# Patient Record
Sex: Female | Born: 1995 | Race: Black or African American | Hispanic: No | Marital: Single | Smoking: Never smoker
Health system: Southern US, Community
[De-identification: ages and names within clinical notes are randomized; demographics above are authoritative.]

## PROBLEM LIST (undated history)

## (undated) DIAGNOSIS — L309 Dermatitis, unspecified: Secondary | ICD-10-CM

## (undated) DIAGNOSIS — D649 Anemia, unspecified: Secondary | ICD-10-CM

## (undated) DIAGNOSIS — F32A Depression, unspecified: Secondary | ICD-10-CM

## (undated) DIAGNOSIS — J45909 Unspecified asthma, uncomplicated: Secondary | ICD-10-CM

## (undated) DIAGNOSIS — E282 Polycystic ovarian syndrome: Secondary | ICD-10-CM

## (undated) DIAGNOSIS — F419 Anxiety disorder, unspecified: Secondary | ICD-10-CM

## (undated) HISTORY — DX: Anxiety disorder, unspecified: F41.9

## (undated) HISTORY — PX: TONSILLECTOMY: SUR1361

## (undated) HISTORY — PX: MULTIPLE TOOTH EXTRACTIONS: SHX2053

## (undated) HISTORY — DX: Depression, unspecified: F32.A

---

## 2021-09-04 ENCOUNTER — Inpatient Hospital Stay (HOSPITAL_COMMUNITY)
Admission: AD | Admit: 2021-09-04 | Discharge: 2021-09-04 | Disposition: A | Discharge status: Home / Self Care | Payer: Medicaid Other | Attending: Obstetrics & Gynecology | Admitting: Obstetrics & Gynecology

## 2021-09-04 ENCOUNTER — Ambulatory Visit (INDEPENDENT_AMBULATORY_CARE_PROVIDER_SITE_OTHER): Payer: Self-pay

## 2021-09-04 ENCOUNTER — Other Ambulatory Visit: Payer: Self-pay

## 2021-09-04 VITALS — BP 163/77 | HR 93 | Ht 66.0 in | Wt 332.9 lb

## 2021-09-04 DIAGNOSIS — Z3200 Encounter for pregnancy test, result unknown: Secondary | ICD-10-CM | POA: Diagnosis present

## 2021-09-04 DIAGNOSIS — Z3201 Encounter for pregnancy test, result positive: Secondary | ICD-10-CM

## 2021-09-04 LAB — POCT PREGNANCY, URINE: Preg Test, Ur: POSITIVE — AB

## 2021-09-04 NOTE — Patient Instructions (Signed)
Prenatal Care Providers           [facility]  [address] [phone]  [facility]   [address]  [phone]  [facility] [place]       [address] [phone]            [facility] [place]     [address] [phone]          [facility] [place]   [address] [phone]  [facility]  [address] [phone]     [facility]  [address]   [phone]      

## 2021-09-04 NOTE — MAU Provider Note (Signed)
Event Date/Time   First Provider Initiated Contact with Patient 09/04/21 1420      S Ms. Whitney Solomon is a 25 y.o. No obstetric history on file. patient who presents to MAU today requesting pregnancy confirmation. She denies any pain or bleeding. She reports positive UPT x2 at home.   O BP 112/88 (BP Location: Right Arm)   Pulse 85   Temp 99.6 F (37.6 C) (Oral)   Resp 20   Ht 5\' 6"  (1.676 m)   Wt (!) 152.4 kg   LMP 07/26/2021   SpO2 99%   BMI 54.23 kg/m  Physical Exam Vitals and nursing note reviewed.  Constitutional:      General: She is not in acute distress.    Appearance: She is well-developed.  HENT:     Head: Normocephalic.  Eyes:     Pupils: Pupils are equal, round, and reactive to light.  Cardiovascular:     Rate and Rhythm: Normal rate.  Pulmonary:     Effort: Pulmonary effort is normal. No respiratory distress.  Abdominal:     General: There is no distension.     Palpations: Abdomen is soft.     Tenderness: There is no abdominal tenderness.  Skin:    General: Skin is warm and dry.  Neurological:     Mental Status: She is alert and oriented to person, place, and time.  Psychiatric:        Mood and Affect: Mood normal.        Behavior: Behavior normal.        Thought Content: Thought content normal.        Judgment: Judgment normal.    A Medical screening exam complete 1. Possible pregnancy      P Discharge from MAU in stable condition Patient given the option of transfer to [facility] for further evaluation or seek care in outpatient facility of choice  List of options for follow-up given  Warning signs for worsening condition that would warrant emergency follow-up discussed Patient may return to MAU as needed   [facility], Rolm Bookbinder 09/04/2021 2:23 PM

## 2021-09-04 NOTE — Progress Notes (Signed)
Pt dropped off urine today for UPT. UPT is positive. Pt states has positive home UPT as well. Pt has hx of 2 SAB in [place]. Pt to sign ROI today for records. Pt states hx of pre-diabetes and possible CHTN.    Pt denies any vaginal bleeding, abd pain or cramps at this time. Pt advised to start taking PNV and to call OB provider for next OB appt. [facility] list given to pt. Pt also needing pregnancy verification letter to change to pregnancy medicaid. Letter given. Pt signed up for Mychart today.  Pt verbalized understanding and agreeable to plan of care.   LMP: 07/26/21 EDC: 05/02/22 [redacted]w[redacted]d   Azzam Mehra, RN

## 2021-09-04 NOTE — Progress Notes (Signed)
Patient seen and assessed by nursing staff.  Agree with documentation and plan.  

## 2021-09-04 NOTE — MAU Note (Signed)
Needs a confirmation of pregnancy.  Denies problems.  +HPT last on the 19th and the 24th.

## 2021-09-19 DIAGNOSIS — J45909 Unspecified asthma, uncomplicated: Secondary | ICD-10-CM

## 2021-09-19 DIAGNOSIS — O99519 Diseases of the respiratory system complicating pregnancy, unspecified trimester: Secondary | ICD-10-CM

## 2021-09-30 DIAGNOSIS — J45909 Unspecified asthma, uncomplicated: Secondary | ICD-10-CM | POA: Insufficient documentation

## 2021-09-30 DIAGNOSIS — O99519 Diseases of the respiratory system complicating pregnancy, unspecified trimester: Secondary | ICD-10-CM | POA: Insufficient documentation

## 2021-09-30 MED ORDER — ALBUTEROL SULFATE HFA 108 (90 BASE) MCG/ACT IN AERS: 2.0000 | INHALATION_SPRAY | Freq: Four times a day (QID) | RESPIRATORY_TRACT | 3 refills | Status: DC | PRN

## 2021-10-05 ENCOUNTER — Encounter (HOSPITAL_COMMUNITY): Payer: Self-pay | Admitting: Physician Assistant

## 2021-10-05 ENCOUNTER — Other Ambulatory Visit: Payer: Self-pay

## 2021-10-05 ENCOUNTER — Inpatient Hospital Stay (HOSPITAL_COMMUNITY)
Admission: AD | Admit: 2021-10-05 | Discharge: 2021-10-06 | Disposition: A | Discharge status: Home / Self Care | Payer: Medicaid Other | Attending: Obstetrics & Gynecology | Admitting: Obstetrics & Gynecology

## 2021-10-05 DIAGNOSIS — Z3A10 10 weeks gestation of pregnancy: Secondary | ICD-10-CM

## 2021-10-05 DIAGNOSIS — O210 Mild hyperemesis gravidarum: Secondary | ICD-10-CM

## 2021-10-05 DIAGNOSIS — O219 Vomiting of pregnancy, unspecified: Secondary | ICD-10-CM | POA: Insufficient documentation

## 2021-10-05 HISTORY — DX: Polycystic ovarian syndrome: E28.2

## 2021-10-05 HISTORY — DX: Dermatitis, unspecified: L30.9

## 2021-10-05 HISTORY — DX: Unspecified asthma, uncomplicated: J45.909

## 2021-10-05 HISTORY — DX: Anemia, unspecified: D64.9

## 2021-10-05 LAB — URINALYSIS, ROUTINE W REFLEX MICROSCOPIC
Bilirubin Urine: NEGATIVE
Glucose, UA: NEGATIVE mg/dL
Hgb urine dipstick: NEGATIVE
Ketones, ur: 20 mg/dL — AB
Nitrite: NEGATIVE
Protein, ur: 30 mg/dL — AB
Specific Gravity, Urine: 1.027 (ref 1.005–1.030)
pH: 6 (ref 5.0–8.0)

## 2021-10-05 MED ORDER — ACETAMINOPHEN 500 MG PO TABS
1000.0000 mg | ORAL_TABLET | Freq: Once | ORAL | Status: AC
  Administered 2021-10-05: 1000 mg via ORAL
  Filled 2021-10-05: qty 2

## 2021-10-05 MED ORDER — SODIUM CHLORIDE 0.9 % IV SOLN
25.0000 mg | Freq: Once | INTRAVENOUS | Status: AC
  Administered 2021-10-05: 25 mg via INTRAVENOUS
  Filled 2021-10-05: qty 1

## 2021-10-05 NOTE — ED Notes (Signed)
Report given to [place] in MAU

## 2021-10-05 NOTE — MAU Provider Note (Signed)
Chief Complaint:  Emesis  HPI: Whitney Solomon is a 25 y.o. G3P0020 at [redacted]w[redacted]d who presents to maternity admissions reporting nausea and vomiting for 1 week. Reports she has not had any vomiting for the past 24 hours and has been able to tolerate PO fluids and food although appetite is decreased. She denies pain, vaginal bleeding, discharge, or urinary s/s.    Pregnancy Course:   Past Medical History:  Diagnosis Date   Anemia    Asthma    Eczema    Polycystic ovarian syndrome    OB History  Gravida Para Term Preterm AB Living  3       2    SAB IAB Ectopic Multiple Live Births  2            # Outcome Date GA Lbr Len/2nd Weight Sex Delivery Anes PTL Lv  3 Current           2 SAB           1 SAB            Past Surgical History:  Procedure Laterality Date   TONSILLECTOMY     Family History  Problem Relation Age of Onset   Breast cancer Mother    Thyroid cancer Mother    Hypertension Mother    Hypertension Father    Hypertension Brother    Social History   Tobacco Use   Smoking status: Never   Smokeless tobacco: Never  Substance Use Topics   Alcohol use: Not Currently   Drug use: Not Currently   No Known Allergies No medications prior to admission.    I have reviewed patient's Past Medical Hx, Surgical Hx, Family Hx, Social Hx, medications and allergies.   ROS:  Review of Systems  Constitutional:  Positive for appetite change (decreased).  Respiratory: Negative.    Cardiovascular: Negative.   Gastrointestinal:  Positive for nausea and vomiting. Negative for abdominal pain.  Genitourinary: Negative.   Musculoskeletal: Negative.   Neurological: Negative.   Psychiatric/Behavioral: Negative.     Physical Exam  Patient Vitals for the past 24 hrs:  BP Temp Temp src Pulse Resp SpO2 Height Weight  10/06/21 0047 124/61 98.1 F (36.7 C) Oral 70 -- 100 % -- --  10/05/21 2100 124/67 -- -- 96 20 99 % -- --  10/05/21 2015 130/76 -- -- 90 18 -- 5\' 6"  (1.676 m) (!) 147.4  kg  10/05/21 1849 (!) 148/89 98.8 F (37.1 C) -- 100 18 100 % -- --   Constitutional: well-developed, well-nourished female in no acute distress.  Cardiovascular: normal rate Respiratory: normal effort GI: abd soft, non-tender MS: extremities nontender, no edema, normal ROM Neurologic: alert and oriented x 4.  Pelvic: not indicated      Labs: Results for orders placed or performed during the hospital encounter of 10/05/21 (from the past 24 hour(s))  Urinalysis, Routine w reflex microscopic Urine, Clean Catch     Status: Abnormal   Collection Time: 10/05/21  8:19 PM  Result Value Ref Range   Color, Urine YELLOW YELLOW   APPearance HAZY (A) CLEAR   Specific Gravity, Urine 1.027 1.005 - 1.030   pH 6.0 5.0 - 8.0   Glucose, UA NEGATIVE NEGATIVE mg/dL   Hgb urine dipstick NEGATIVE NEGATIVE   Bilirubin Urine NEGATIVE NEGATIVE   Ketones, ur 20 (A) NEGATIVE mg/dL   Protein, ur 30 (A) NEGATIVE mg/dL   Nitrite NEGATIVE NEGATIVE   Leukocytes,Ua MODERATE (A) NEGATIVE   RBC /  HPF 0-5 0 - 5 RBC/hpf   WBC, UA 6-10 0 - 5 WBC/hpf   Bacteria, UA MANY (A) NONE SEEN   Squamous Epithelial / LPF 11-20 0 - 5   Mucus PRESENT     Imaging:  No results found.  MAU Course: Orders Placed This Encounter  Procedures   OB Urine Culture   Urinalysis, Routine w reflex microscopic Urine, Clean Catch   Discharge patient   Meds ordered this encounter  Medications   promethazine (PHENERGAN) 25 mg in sodium chloride 0.9 % 1,000 mL infusion   acetaminophen (TYLENOL) tablet 1,000 mg   promethazine (PHENERGAN) 25 MG tablet    Sig: Take 1 tablet (25 mg total) by mouth every 6 (six) hours as needed for nausea or vomiting.    Dispense:  30 tablet    Refill:  0    Order Specific Question:   Supervising Provider    Answer:   Samara Snide    MDM: UA with culture pending IVF bolus with promethazine Tylenol for HA Patient reports nausea improved after medication, able to tolerate  PO  Assessment: 1. Nausea and vomiting during pregnancy     Plan: Discharge home in stable condition Strict return precautions reviewed. Return to MAU if symptoms worsen Keep appointment as scheduled on 11/16  Rx for phenergan sent    Follow-up Information     [facility] Follow up.   Specialty: Obstetrics and Gynecology Why: as scheduled. Return to MAU for worsening symptoms Contact information: [address] [place] [address] [phone]                Allergies as of 10/06/2021   No Known Allergies      Medication List     TAKE these medications    albuterol 108 (90 Base) MCG/ACT inhaler Commonly known as: VENTOLIN HFA Inhale 2 puffs into the lungs every 6 (six) hours as needed for wheezing or shortness of breath.   multivitamin-prenatal 27-0.8 MG Tabs tablet Take 1 tablet by mouth daily at 12 noon.   promethazine 25 MG tablet Commonly known as: PHENERGAN Take 1 tablet (25 mg total) by mouth every 6 (six) hours as needed for nausea or vomiting.         Camelia Eng, CNM 10/06/2021 1:28 AM

## 2021-10-05 NOTE — MAU Note (Signed)
Pt c/o n/v x 1.5 weeks. Unable to eat. Pt reports she has taken dramamine but that is not helping.

## 2021-10-05 NOTE — ED Triage Notes (Signed)
Pt states she is [redacted] weeks pregnant and c/o nausea and vomiting.

## 2021-10-05 NOTE — ED Provider Notes (Signed)
Emergency Medicine Provider OB Triage Evaluation Note  ALAZAY LEICHT is a 25 y.o. female, G3P0020  at Unknown gestation who presents to the emergency department with complaints of nausea and vomiting.    Review of  Systems  Positive: Nausea and vomiting Negative: fevers   Physical Exam  BP (!) 148/89 (BP Location: Right Arm)   Pulse 100   Temp 98.8 F (37.1 C)   Resp 18   LMP 07/26/2021 (Exact Date)   SpO2 100%  General: Awake, no distress  HEENT: Atraumatic  Resp: Normal effort  Cardiac: Normal rate Abd: Nondistended, nontender  MSK: Moves all extremities without difficulty Neuro: Speech clear  Medical Decision Making  Pt evaluated for pregnancy concern and is stable for transfer to MAU. Pt is in agreement with plan for transfer.  6:52 PM Discussed with MAU APP, Victorino Dike, who accepts patient in transfer.  Clinical Impression   1. Nausea and vomiting during pregnancy        Norman Clay 10/05/21 6195    Tegeler, Canary Brim, MD 10/05/21 2018

## 2021-10-06 MED ORDER — PROMETHAZINE HCL 25 MG PO TABS: 25.0000 mg | ORAL_TABLET | Freq: Four times a day (QID) | ORAL | 0 refills | Status: DC | PRN

## 2021-10-07 LAB — CULTURE, OB URINE

## 2021-10-10 ENCOUNTER — Telehealth (INDEPENDENT_AMBULATORY_CARE_PROVIDER_SITE_OTHER): Payer: Medicaid Other

## 2021-10-10 DIAGNOSIS — Z3490 Encounter for supervision of normal pregnancy, unspecified, unspecified trimester: Secondary | ICD-10-CM

## 2021-10-10 NOTE — Progress Notes (Signed)
Called pt at 1421; VM left stating I am calling to begin virtual appt. Direct link sent to MyChart video visit.   Called pt at 1436; VM left stating I am calling for virtual appt. Requested pt return call to the office or respond to MyChart message to reschedule.

## 2021-10-15 ENCOUNTER — Inpatient Hospital Stay (HOSPITAL_COMMUNITY)
Admission: AD | Admit: 2021-10-15 | Discharge: 2021-10-15 | Disposition: A | Discharge status: Home / Self Care | Payer: Medicaid Other | Attending: Obstetrics and Gynecology | Admitting: Obstetrics and Gynecology

## 2021-10-15 ENCOUNTER — Encounter (HOSPITAL_COMMUNITY): Payer: Self-pay | Admitting: Obstetrics and Gynecology

## 2021-10-15 ENCOUNTER — Other Ambulatory Visit: Payer: Self-pay

## 2021-10-15 DIAGNOSIS — O219 Vomiting of pregnancy, unspecified: Secondary | ICD-10-CM

## 2021-10-15 DIAGNOSIS — Z3A11 11 weeks gestation of pregnancy: Secondary | ICD-10-CM | POA: Diagnosis not present

## 2021-10-15 LAB — URINALYSIS, ROUTINE W REFLEX MICROSCOPIC
Bilirubin Urine: NEGATIVE
Glucose, UA: NEGATIVE mg/dL
Hgb urine dipstick: NEGATIVE
Ketones, ur: NEGATIVE mg/dL
Nitrite: NEGATIVE
Protein, ur: 30 mg/dL — AB
Specific Gravity, Urine: 1.029 (ref 1.005–1.030)
pH: 6 (ref 5.0–8.0)

## 2021-10-15 MED ORDER — ONDANSETRON 8 MG PO TBDP: 8.0000 mg | ORAL_TABLET | Freq: Three times a day (TID) | ORAL | 0 refills | Status: DC | PRN

## 2021-10-15 MED ORDER — ONDANSETRON 4 MG PO TBDP
8.0000 mg | ORAL_TABLET | Freq: Once | ORAL | Status: AC
  Administered 2021-10-15: 8 mg via ORAL
  Filled 2021-10-15: qty 2

## 2021-10-15 NOTE — MAU Note (Signed)
Was here about 1 1/2 wks ago for n/v.  Was given a medication.  Worked first few days, and now it's just not working.  Can't keep anything down, so she is not eating. Stomach hurts a little from throwing up.

## 2021-10-15 NOTE — MAU Provider Note (Signed)
History     CSN: [id number]  Arrival date and time: 10/15/21 1548   Event Date/Time   First Provider Initiated Contact with Patient 10/15/21 1810      Chief Complaint  Patient presents with   Emesis   Whitney Solomon is a 25 y.o. G3P0020 at [redacted]w[redacted]d who presents today with nausea and vomiting. She reports that she was here a few weeks ago, and given a medication. She states that it was working until a few days ago and then it stopped. She last took it on 10/11/2021.   Emesis  This is a new problem. The current episode started 1 to 4 weeks ago. The problem occurs 2 to 4 times per day. The problem has been unchanged. The emesis has an appearance of stomach contents. There has been no fever. Risk factors: pregnancy. Treatments tried: phenergan. Improvement on treatment: was working, but stopped. Last took on 11/4.   OB History     Gravida  3   Para      Term      Preterm      AB  2   Living         SAB  2   IAB      Ectopic      Multiple      Live Births              Past Medical History:  Diagnosis Date   Anemia    Asthma    Eczema    Polycystic ovarian syndrome     Past Surgical History:  Procedure Laterality Date   TONSILLECTOMY      Family History  Problem Relation Age of Onset   Breast cancer Mother    Thyroid cancer Mother    Hypertension Mother    Hypertension Father    Hypertension Brother     Social History   Tobacco Use   Smoking status: Never   Smokeless tobacco: Never  Substance Use Topics   Alcohol use: Not Currently   Drug use: Not Currently    Allergies: No Known Allergies  Medications Prior to Admission  Medication Sig Dispense Refill Last Dose   albuterol (VENTOLIN HFA) 108 (90 Base) MCG/ACT inhaler Inhale 2 puffs into the lungs every 6 (six) hours as needed for wheezing or shortness of breath. 8 g 3    Prenatal Vit-Fe Fumarate-FA (MULTIVITAMIN-PRENATAL) 27-0.8 MG TABS tablet Take 1 tablet by mouth daily at 12 noon.    10/09/2021   promethazine (PHENERGAN) 25 MG tablet Take 1 tablet (25 mg total) by mouth every 6 (six) hours as needed for nausea or vomiting. 30 tablet 0 10/11/2021    Review of Systems  Gastrointestinal:  Positive for vomiting.  All other systems reviewed and are negative. Physical Exam   Blood pressure 125/78, pulse 78, temperature 98.2 F (36.8 C), temperature source Oral, resp. rate 18, height 5\' 6"  (1.676 m), weight (!) 145.4 kg, last menstrual period 07/26/2021, SpO2 100 %.  Physical Exam Vitals and nursing note reviewed.  Constitutional:      General: She is not in acute distress. HENT:     Head: Normocephalic.  Eyes:     Pupils: Pupils are equal, round, and reactive to light.  Cardiovascular:     Rate and Rhythm: Normal rate.  Pulmonary:     Effort: Pulmonary effort is normal.  Abdominal:     Palpations: Abdomen is soft.     Tenderness: There is no abdominal tenderness.  Skin:  General: Skin is warm and dry.  Neurological:     Mental Status: She is alert and oriented to person, place, and time.  Psychiatric:        Behavior: Behavior normal.   Pt informed that the ultrasound is considered a limited OB ultrasound and is not intended to be a complete ultrasound exam.  Patient also informed that the ultrasound is not being completed with the intent of assessing for fetal or placental anomalies or any pelvic abnormalities.  Explained that the purpose of today's ultrasound is to assess for  viability.  Patient acknowledges the purpose of the exam and the limitations of the study.    FHT 160  Results for orders placed or performed during the hospital encounter of 10/15/21 (from the past 24 hour(s))  Urinalysis, Routine w reflex microscopic Urine, Clean Catch     Status: Abnormal   Collection Time: 10/15/21  5:36 PM  Result Value Ref Range   Color, Urine AMBER (A) YELLOW   APPearance CLOUDY (A) CLEAR   Specific Gravity, Urine 1.029 1.005 - 1.030   pH 6.0 5.0 - 8.0    Glucose, UA NEGATIVE NEGATIVE mg/dL   Hgb urine dipstick NEGATIVE NEGATIVE   Bilirubin Urine NEGATIVE NEGATIVE   Ketones, ur NEGATIVE NEGATIVE mg/dL   Protein, ur 30 (A) NEGATIVE mg/dL   Nitrite NEGATIVE NEGATIVE   Leukocytes,Ua LARGE (A) NEGATIVE   RBC / HPF 6-10 0 - 5 RBC/hpf   WBC, UA 6-10 0 - 5 WBC/hpf   Bacteria, UA RARE (A) NONE SEEN   Squamous Epithelial / LPF 11-20 0 - 5   Mucus PRESENT    Amorphous Crystal PRESENT      MAU Course  Procedures  MDM  Patient given zofran ODT. She is tolerating PO.   Assessment and Plan   1. Nausea/vomiting in pregnancy   2. [redacted] weeks gestation of pregnancy    DC home Comfort measures reviewed  1st Trimester precautions  RX: zofran 8mg  ODT #20  Return to MAU as needed Start prenatal care    Follow-up Information     Department, [facility] Follow up.   Contact information: [address] [place] [phone]                [phone] DNP, CNM  10/15/21  7:55 PM

## 2021-10-21 ENCOUNTER — Other Ambulatory Visit: Payer: Self-pay

## 2021-10-21 ENCOUNTER — Encounter: Payer: Self-pay | Admitting: *Deleted

## 2021-10-21 ENCOUNTER — Telehealth (INDEPENDENT_AMBULATORY_CARE_PROVIDER_SITE_OTHER): Payer: Medicaid Other | Admitting: *Deleted

## 2021-10-21 DIAGNOSIS — Z3490 Encounter for supervision of normal pregnancy, unspecified, unspecified trimester: Secondary | ICD-10-CM | POA: Insufficient documentation

## 2021-10-21 DIAGNOSIS — J45909 Unspecified asthma, uncomplicated: Secondary | ICD-10-CM

## 2021-10-21 DIAGNOSIS — Z3A00 Weeks of gestation of pregnancy not specified: Secondary | ICD-10-CM

## 2021-10-21 DIAGNOSIS — O99210 Obesity complicating pregnancy, unspecified trimester: Secondary | ICD-10-CM

## 2021-10-21 MED ORDER — GOJJI WEIGHT SCALE MISC
1.0000 | Freq: Once | 0 refills | Status: AC
Start: 2021-10-21 — End: 2021-10-21

## 2021-10-21 MED ORDER — BLOOD PRESSURE KIT DEVI: 1.0000 | 0 refills | Status: DC | PRN

## 2021-10-21 NOTE — Patient Instructions (Signed)
  At our [facility] OB/GYN Practices, we work as an integrated team, providing care to address both physical and emotional health. Your medical provider may refer you to see our Behavioral Health Clinician [facility]) on the same day you see your medical provider, as availability permits; often scheduled virtually at your convenience.  Our [facility] is available to all patients, visits generally last between 20-30 minutes, but can be longer or shorter, depending on patient need. The [facility] offers help with stress management, coping with symptoms of depression and anxiety, major life changes , sleep issues, changing risky behavior, grief and loss, life stress, working on personal life goals, and  behavioral health issues, as these all affect your overall health and wellness.  The [facility] is NOT available for the following: FMLA paperwork, court-ordered evaluations, specialty assessments (custody or disability), letters to employers, or obtaining certification for an emotional support animal. The [facility] does not provide long-term therapy. You have the right to refuse integrated behavioral health services, or to reschedule to see the [facility] at a later date.  Confidentiality exception: If it is suspected that a child or disabled adult is being abused or neglected, we are required by law to report that to either Child Protective Services or Adult Management consultant.  If you have a diagnosis of Bipolar affective disorder, Schizophrenia, or recurrent Major depressive disorder, we will recommend that you establish care with a psychiatrist, as these are lifelong, chronic conditions, and we want your overall emotional health and medications to be more closely monitored. If you anticipate needing extended maternity leave due to mental health issues postpartum, it it recommended you inform your medical provider, so we can put in a referral to a psychiatrist as soon as possible. The [facility] is unable to recommend an extended maternity leave for mental  health issues. Your medical provider or [facility] may refer you to a therapist for ongoing, traditional therapy, or to a psychiatrist, for medication management, if it would benefit your overall health. Depending on your insurance, you may have a copay or be charged a deductible, depending on your insurance, to see the [facility]. If you are uninsured, it is recommended that you apply for financial assistance. (Forms may be requested at the front desk for in-person visits, via MyChart, or request a form during a virtual visit).  If you see the [facility] more than 6 times, you will have to complete a comprehensive clinical assessment interview with the [facility] to resume integrated services.  For virtual visits with the [facility], you must be physically in the state of [place] at the time of the visit. For example, if you live in [place], you will have to do an in-person visit with the [facility], and your out-of-state insurance may not cover behavioral health services in [place]. If you are going out of the state or country for any reason, the [facility] may see you virtually when you return to [place], but not while you are physically outside of [place].     Here is a link to the Pregnancy Navigators . Please Fill out prior to your New OB appointment.   English Link: [email or web address]  Spanish Link: [email or web address]

## 2021-10-21 NOTE — Progress Notes (Signed)
New OB Intake  I connected with  Whitney Solomon on 10/21/21 at 9:39 by MyChart Video Visit and verified that I am speaking with the correct person using two identifiers. Nurse is located at [facility] and pt is located at home.  I discussed the limitations, risks, security and privacy concerns of performing an evaluation and management service by telephone and the availability of in person appointments. I also discussed with the patient that there may be a patient responsible charge related to this service. The patient expressed understanding and agreed to proceed.  I explained I am completing New OB Intake today. We discussed her EDD of 05/02/22 that is based on LMP of 07/26/22. She states she had an [place] at the [facility] that  said her EDD 05/12/21. I asked her to call them to send the [place] report to [place]. Pt is G3/P0020. I reviewed her allergies, medications, Medical/Surgical/OB history, and appropriate screenings. I informed her of [facility] services. Based on history, this is a/an  pregnancy uncomplicated .   Patient Active Problem List   Diagnosis Date Noted   Supervision of low-risk pregnancy 10/21/2021   Asthma    Obesity in pregnancy    Asthma during pregnancy 09/30/2021    Concerns addressed today  Delivery Plans:  Plans to deliver at [facility].   MyChart/Babyscripts MyChart access verified. I explained pt will have some visits in office and some virtually. Babyscripts instructions given and order placed. Patient verifies receipt of registration text/e-mail.   Blood Pressure Cuff  Blood pressure cuff ordered for patient to pick-up from [employer]. Explained after first prenatal appt pt will check weekly and document in Babyscripts.  Weight scale: Patient  does not   have weight scale. Weight scale ordered for patient to pick up form [facility].   Anatomy US Explained first scheduled [place] will be around 19 weeks. Anatomy US will be scheduled for and patient notified by MyChart.    Labs Discussed Avelina Laine genetic screening with patient. Would like both Panorama and Horizon drawn at new OB visit. Routine prenatal labs needed.  Covid Vaccine Patient has had covid vaccine.   Centering in pregnancy offered, patient declines.   Mother/ Baby Dyad Candidate?    If yes, offer as possibility. Patient declines  Informed patient of [facility] website  and placed link in her AVS.   Social Determinants of Health Food Insecurity: Patient expresses food insecurity. Food Market information given to patient; explained patient may visit at the end of first OB appointment. WIC Referral: Patient is interested in referral to [facility].  Transportation: Patient denies transportation needs. Childcare: Discussed no children allowed at ultrasound appointments. Offered childcare services; patient declines childcare services at this time.  Informed about  Pregnancy Navigators and link sent.    Placed OB Box on problem list and updated  First visit review I reviewed new OB appt with pt. I explained she will have a pelvic exam, ob bloodwork with genetic screening, and PAP smear. Explained pt will be seen by Payton Emerald at first visit; encounter routed to appropriate provider. Explained that patient will be seen by pregnancy navigator following visit with provider. [facility] information placed in AVS.   Millard Bautch,RN 10/21/2021  1:09 PM

## 2021-10-22 NOTE — Progress Notes (Signed)
Patient was assessed and managed by nursing staff during this encounter. I have reviewed the chart and agree with the documentation and plan.   Edd Arbour, MSN, CNM, [facility] 10/22/21 4:23 PM

## 2021-10-23 ENCOUNTER — Ambulatory Visit (INDEPENDENT_AMBULATORY_CARE_PROVIDER_SITE_OTHER): Payer: Medicaid Other | Admitting: Certified Nurse Midwife

## 2021-10-23 ENCOUNTER — Other Ambulatory Visit: Payer: Self-pay

## 2021-10-23 ENCOUNTER — Encounter: Payer: Self-pay | Admitting: Certified Nurse Midwife

## 2021-10-23 VITALS — BP 118/76 | HR 86 | Wt 320.3 lb

## 2021-10-23 DIAGNOSIS — O99210 Obesity complicating pregnancy, unspecified trimester: Secondary | ICD-10-CM | POA: Diagnosis not present

## 2021-10-23 DIAGNOSIS — Z3490 Encounter for supervision of normal pregnancy, unspecified, unspecified trimester: Secondary | ICD-10-CM

## 2021-10-23 DIAGNOSIS — Z3A12 12 weeks gestation of pregnancy: Secondary | ICD-10-CM | POA: Diagnosis not present

## 2021-10-23 DIAGNOSIS — Z5941 Food insecurity: Secondary | ICD-10-CM

## 2021-10-23 DIAGNOSIS — Z23 Encounter for immunization: Secondary | ICD-10-CM

## 2021-10-23 DIAGNOSIS — Z3491 Encounter for supervision of normal pregnancy, unspecified, first trimester: Secondary | ICD-10-CM

## 2021-10-23 DIAGNOSIS — O219 Vomiting of pregnancy, unspecified: Secondary | ICD-10-CM

## 2021-10-23 LAB — POCT URINALYSIS DIP (DEVICE)
Bilirubin Urine: NEGATIVE
Glucose, UA: NEGATIVE mg/dL
Hgb urine dipstick: NEGATIVE
Ketones, ur: NEGATIVE mg/dL
Leukocytes,Ua: NEGATIVE
Nitrite: NEGATIVE
Protein, ur: NEGATIVE mg/dL
Specific Gravity, Urine: >=1.03 (ref 1.005–1.030)
Urobilinogen, UA: 0.2 mg/dL (ref 0.0–1.0)
pH: 6 (ref 5.0–8.0)

## 2021-10-23 MED ORDER — BLOOD PRESSURE KIT DEVI: 1.0000 | 0 refills | Status: DC | PRN

## 2021-10-23 MED ORDER — ONDANSETRON 8 MG PO TBDP
8.0000 mg | ORAL_TABLET | Freq: Three times a day (TID) | ORAL | 3 refills | Status: AC | PRN
Start: 2021-10-23 — End: ?

## 2021-10-23 NOTE — Progress Notes (Signed)
Home Medicaid Form completed  [facility]  10/23/21

## 2021-10-23 NOTE — Progress Notes (Signed)
Patient had elevated PHQ-9/GAD-7. Patient declined seeing [facility].   Alesia Richards, RN 10/23/21

## 2021-10-24 LAB — CBC/D/PLT+RPR+RH+ABO+RUBIGG...
Antibody Screen: NEGATIVE
Basophils Absolute: 0 10*3/uL (ref 0.0–0.2)
Basos: 0 %
EOS (ABSOLUTE): 0.4 10*3/uL (ref 0.0–0.4)
Eos: 5 %
HCV Ab: 0.1 s/co ratio (ref 0.0–0.9)
HIV Screen 4th Generation wRfx: NONREACTIVE
Hematocrit: 38.9 % (ref 34.0–46.6)
Hemoglobin: 12.8 g/dL (ref 11.1–15.9)
Hepatitis B Surface Ag: NEGATIVE
Immature Grans (Abs): 0 10*3/uL (ref 0.0–0.1)
Immature Granulocytes: 0 %
Lymphocytes Absolute: 2.1 10*3/uL (ref 0.7–3.1)
Lymphs: 27 %
MCH: 25.7 pg — ABNORMAL LOW (ref 26.6–33.0)
MCHC: 32.9 g/dL (ref 31.5–35.7)
MCV: 78 fL — ABNORMAL LOW (ref 79–97)
Monocytes Absolute: 0.4 10*3/uL (ref 0.1–0.9)
Monocytes: 5 %
Neutrophils Absolute: 4.9 10*3/uL (ref 1.4–7.0)
Neutrophils: 63 %
Platelets: 361 10*3/uL (ref 150–450)
RBC: 4.99 x10E6/uL (ref 3.77–5.28)
RDW: 15.5 % — ABNORMAL HIGH (ref 11.7–15.4)
RPR Ser Ql: NONREACTIVE
Rh Factor: POSITIVE
Rubella Antibodies, IGG: 3.49 index (ref >0.99)
WBC: 7.9 10*3/uL (ref 3.4–10.8)

## 2021-10-24 LAB — HEMOGLOBIN A1C
Est. average glucose Bld gHb Est-mCnc: 123 mg/dL
Hgb A1c MFr Bld: 5.9 % — ABNORMAL HIGH (ref 4.8–5.6)

## 2021-10-24 LAB — HCV INTERPRETATION

## 2021-10-24 NOTE — Progress Notes (Signed)
History:   Whitney Solomon is a 25 y.o. G3P0020 at 41w6dby LMP, early ultrasound being seen today for her first obstetrical visit.  Her obstetrical history is significant for  two previous early first trimester losses . Patient does intend to breast feed. Pregnancy history fully reviewed.  Patient reports  improved nausea/vomiting, says the zofran is working well for her. Still has severe nausea with 5-6 episodes of vomiting per day if she does not take zofran. Doing well otherwise .      HISTORY: OB History  Gravida Para Term Preterm AB Living  3 0 0 0 2 0  SAB IAB Ectopic Multiple Live Births  2 0 0 0 0    # Outcome Date GA Lbr Len/2nd Weight Sex Delivery Anes PTL Lv  3 Current           2 SAB 06/2019 [id number]       1 SAB 01/2017 [id number]        Pt says [place] was completed recently in [place] (and was normal), has filled out records request.  Past Medical History:  Diagnosis Date   Anemia    Anxiety    Asthma    Depression    Eczema    Polycystic ovarian syndrome    Past Surgical History:  Procedure Laterality Date   MULTIPLE TOOTH EXTRACTIONS     TONSILLECTOMY     Family History  Problem Relation Age of Onset   Depression Mother    Anxiety disorder Mother    Hyperlipidemia Mother    Breast cancer Mother    Thyroid cancer Mother    Hypertension Mother    Fibromyalgia Mother    Bipolar disorder Mother    Fibroids Mother    Hypertension Father    Bipolar disorder Father    Thyroid disease Sister    Hypertension Brother    Social History   Tobacco Use   Smoking status: Never   Smokeless tobacco: Never  Vaping Use   Vaping Use: Never used  Substance Use Topics   Alcohol use: Not Currently    Comment: occasional before pregnancy   Drug use: Never   No Known Allergies Current Outpatient Medications on File Prior to Visit  Medication Sig Dispense Refill   albuterol (VENTOLIN HFA) 108 (90 Base) MCG/ACT inhaler Inhale 2 puffs into the lungs every 6 (six) hours as  needed for wheezing or shortness of breath. 8 g 3   Prenatal Vit-Fe Fumarate-FA (MULTIVITAMIN-PRENATAL) 27-0.8 MG TABS tablet Take 1 tablet by mouth daily at 12 noon.     triamcinolone cream (KENALOG) 0.5 % Apply 1 application topically 3 (three) times daily.     No current facility-administered medications on file prior to visit.   Review of Systems Pertinent items noted in HPI and remainder of comprehensive ROS otherwise negative. Physical Exam:   Vitals:   10/23/21 1038  BP: 118/76  Pulse: 86  Weight: (!) 320 lb 4.8 oz (145.3 kg)  Attempted to find FHR with doppler, unable to so proceeded with bedside ultrasound.  Patient informed that the ultrasound is considered a limited obstetric ultrasound and is not intended to be a complete ultrasound exam.  Patient also informed that the ultrasound is not being completed with the intent of assessing for fetal or placental anomalies or any pelvic abnormalities.  Explained that the purpose of today's ultrasound is to assess for fetal heart rate.  Patient acknowledges the purpose of the exam and  the limitations of the study.  Bedside Ultrasound for FHR check: Viable intrauterine pregnancy with positive cardiac activity note, then able to find FHR with Doppler  Fetal Heart Rate (bpm): 169  Constitutional: Well-developed, well-nourished pregnant female in no acute distress.  HEENT: PERRLA Skin: normal color and turgor, no rash Cardiovascular: normal rate & rhythm Respiratory: normal effort GI: Abd soft, non-tender, pos BS x 4, gravid appropriate for gestational age MS: Extremities nontender, no edema, normal ROM Neurologic: Alert and oriented x 4.  GU: no CVA tenderness Pelvic: exam deferred  Assessment & Plan:  1. Encounter for supervision of low-risk pregnancy, antepartum - Doing well overall, not feeling fetal movement yet - Genetic Screening - CBC/D/Plt+RPR+Rh+ABO+RubIgG... - Flu Vaccine QUAD 80moIM (Fluarix, Fluzone & Alfiuria Quad  PF) - Culture, OB Urine - Hemoglobin A1c - Blood Pressure Monitoring (BLOOD PRESSURE KIT) DEVI; 1 Device by Does not apply route as needed.  Dispense: 1 each; Refill: 0  2. [redacted] weeks gestation of pregnancy - Routine OB care   3. Obesity in pregnancy - Discussed increased risks associated with obesity and how to mitigate these risks by eating frequent small, protein rich meals, increased hydration and gentle exercise. - Discouraged her from engaging in strict calorie restriction in pregnancy or starvation techniques trying to lose weight.  4. Food insecurity - [facility]ULATORY REFERRAL TO [facility]  5. Nausea and vomiting during pregnancy - ondansetron (ZOFRAN ODT) 8 MG disintegrating tablet; Take 1 tablet (8 mg total) by mouth every 8 (eight) hours as needed for nausea or vomiting.  Dispense: 60 tablet; Refill: 3  6. Initial OB visit in first trimester - Initial labs drawn. - Continue prenatal vitamins. - Problem list reviewed and updated. - Genetic Screening discussed, First trimester screen, Quad screen, and NIPS: ordered. - Ultrasound discussed; fetal anatomic survey: ordered. - Anticipatory guidance about prenatal visits given including labs, ultrasounds, and testing. - Discussed usage of Babyscripts and virtual visits as additional source of managing and completing prenatal visits in midst of coronavirus and pandemic.   - Encouraged to complete MyChart Registration for her ability to review results, send requests, and have questions addressed.  - The [facility] with multiple MDs and Advanced Practice Providers was explained to patient; also emphasized that residents, students are part of our team. - Routine obstetric precautions reviewed. Encouraged to seek out care at office or emergency room ([facility]) for urgent and/or emergent concerns.  Return in about 4 weeks (around 11/20/2021) for IN-PERSON, LOB.      JGaylan Gerold MSN, CNM, IVarnaCertified Nurse Midwife, [facility]

## 2021-10-25 ENCOUNTER — Encounter: Payer: Self-pay | Admitting: Certified Nurse Midwife

## 2021-10-25 LAB — URINE CULTURE, OB REFLEX

## 2021-10-25 LAB — CULTURE, OB URINE

## 2021-10-28 ENCOUNTER — Telehealth: Payer: Self-pay | Admitting: General Practice

## 2021-10-28 NOTE — Telephone Encounter (Signed)
-----   Message from Whitney Solomon, [place] sent at 10/25/2021  9:13 PM EST ----- Please have pt scheduled for early 2-hr GTT, remind her to premedicate with zofran before she comes in.

## 2021-10-28 NOTE — Telephone Encounter (Signed)
Attempted to call patient for results, phone would ring once then heard busy tone. Will send mychart message.

## 2021-10-29 ENCOUNTER — Encounter: Payer: Self-pay | Admitting: *Deleted

## 2021-11-01 ENCOUNTER — Encounter: Payer: Self-pay | Admitting: Certified Nurse Midwife

## 2021-11-04 ENCOUNTER — Other Ambulatory Visit: Payer: Self-pay | Admitting: *Deleted

## 2021-11-04 DIAGNOSIS — O99810 Abnormal glucose complicating pregnancy: Secondary | ICD-10-CM

## 2021-11-06 ENCOUNTER — Other Ambulatory Visit: Payer: Medicaid Other

## 2021-11-09 ENCOUNTER — Encounter: Payer: Self-pay | Admitting: Certified Nurse Midwife

## 2021-11-13 ENCOUNTER — Other Ambulatory Visit: Payer: Medicaid Other

## 2021-11-20 ENCOUNTER — Encounter: Payer: Medicaid Other | Admitting: Certified Nurse Midwife

## 2021-11-20 ENCOUNTER — Encounter: Payer: Self-pay | Admitting: Certified Nurse Midwife

## 2021-11-25 ENCOUNTER — Encounter: Payer: Medicaid Other | Admitting: Family Medicine

## 2021-11-25 ENCOUNTER — Other Ambulatory Visit: Payer: Medicaid Other

## 2021-11-27 ENCOUNTER — Encounter: Payer: Medicaid Other | Admitting: Family Medicine

## 2021-11-27 ENCOUNTER — Other Ambulatory Visit: Payer: Medicaid Other

## 2021-12-04 ENCOUNTER — Encounter: Payer: Self-pay | Admitting: Certified Nurse Midwife

## 2021-12-10 ENCOUNTER — Other Ambulatory Visit: Payer: Self-pay

## 2021-12-10 ENCOUNTER — Ambulatory Visit: Payer: Medicaid Other | Admitting: *Deleted

## 2021-12-10 ENCOUNTER — Other Ambulatory Visit: Payer: Self-pay | Admitting: *Deleted

## 2021-12-10 ENCOUNTER — Encounter: Payer: Self-pay | Admitting: *Deleted

## 2021-12-10 ENCOUNTER — Ambulatory Visit: Payer: Medicaid Other | Attending: Certified Nurse Midwife

## 2021-12-10 VITALS — BP 143/82 | HR 106

## 2021-12-10 DIAGNOSIS — J45909 Unspecified asthma, uncomplicated: Secondary | ICD-10-CM | POA: Diagnosis present

## 2021-12-10 DIAGNOSIS — O99512 Diseases of the respiratory system complicating pregnancy, second trimester: Secondary | ICD-10-CM | POA: Insufficient documentation

## 2021-12-10 DIAGNOSIS — O99891 Other specified diseases and conditions complicating pregnancy: Secondary | ICD-10-CM | POA: Insufficient documentation

## 2021-12-10 DIAGNOSIS — Z3A18 18 weeks gestation of pregnancy: Secondary | ICD-10-CM | POA: Insufficient documentation

## 2021-12-10 DIAGNOSIS — Z3490 Encounter for supervision of normal pregnancy, unspecified, unspecified trimester: Secondary | ICD-10-CM

## 2021-12-10 DIAGNOSIS — Z363 Encounter for antenatal screening for malformations: Secondary | ICD-10-CM | POA: Insufficient documentation

## 2021-12-10 DIAGNOSIS — O99210 Obesity complicating pregnancy, unspecified trimester: Secondary | ICD-10-CM

## 2021-12-10 DIAGNOSIS — O99519 Diseases of the respiratory system complicating pregnancy, unspecified trimester: Secondary | ICD-10-CM

## 2021-12-10 DIAGNOSIS — Z3492 Encounter for supervision of normal pregnancy, unspecified, second trimester: Secondary | ICD-10-CM

## 2021-12-10 DIAGNOSIS — O99212 Obesity complicating pregnancy, second trimester: Secondary | ICD-10-CM | POA: Insufficient documentation

## 2021-12-10 DIAGNOSIS — Z6841 Body Mass Index (BMI) 40.0 and over, adult: Secondary | ICD-10-CM

## 2021-12-10 DIAGNOSIS — L309 Dermatitis, unspecified: Secondary | ICD-10-CM

## 2021-12-10 MED ORDER — TRIAMCINOLONE ACETONIDE 0.5 % EX OINT: 1.0000 "application " | TOPICAL_OINTMENT | Freq: Two times a day (BID) | CUTANEOUS | 0 refills | Status: AC

## 2021-12-16 ENCOUNTER — Ambulatory Visit (INDEPENDENT_AMBULATORY_CARE_PROVIDER_SITE_OTHER): Payer: Medicaid Other | Admitting: Family Medicine

## 2021-12-16 ENCOUNTER — Other Ambulatory Visit: Payer: Self-pay

## 2021-12-16 ENCOUNTER — Other Ambulatory Visit: Payer: Medicaid Other

## 2021-12-16 VITALS — BP 132/85 | HR 107 | Wt 310.0 lb

## 2021-12-16 DIAGNOSIS — F32A Depression, unspecified: Secondary | ICD-10-CM

## 2021-12-16 DIAGNOSIS — Z3492 Encounter for supervision of normal pregnancy, unspecified, second trimester: Secondary | ICD-10-CM

## 2021-12-16 DIAGNOSIS — F419 Anxiety disorder, unspecified: Secondary | ICD-10-CM

## 2021-12-16 DIAGNOSIS — O99210 Obesity complicating pregnancy, unspecified trimester: Secondary | ICD-10-CM

## 2021-12-16 DIAGNOSIS — Z3490 Encounter for supervision of normal pregnancy, unspecified, unspecified trimester: Secondary | ICD-10-CM

## 2021-12-16 DIAGNOSIS — Z5941 Food insecurity: Secondary | ICD-10-CM

## 2021-12-16 DIAGNOSIS — O99519 Diseases of the respiratory system complicating pregnancy, unspecified trimester: Secondary | ICD-10-CM

## 2021-12-16 DIAGNOSIS — Z3A19 19 weeks gestation of pregnancy: Secondary | ICD-10-CM

## 2021-12-16 DIAGNOSIS — J45909 Unspecified asthma, uncomplicated: Secondary | ICD-10-CM

## 2021-12-16 MED ORDER — BLOOD PRESSURE KIT DEVI
1.0000 | 0 refills | Status: AC | PRN
Start: 2021-12-16 — End: ?

## 2021-12-16 MED ORDER — BLOOD PRESSURE KIT DEVI: 1.0000 | 0 refills | Status: DC | PRN

## 2021-12-16 NOTE — Progress Notes (Signed)
° ° °  Subjective:  Whitney Solomon is a 26 y.o. G3P0020 at [redacted]w[redacted]d being seen today for ongoing prenatal care.  She is currently monitored for the following issues for this low-risk pregnancy and has Asthma during pregnancy; Supervision of low-risk pregnancy; and Obesity in pregnancy on their problem list.  Patient reports worsening of her chronic anxiety and depression. Reports she has little motivation to do things, sleeping more. Reports she has been struggling with mood since she was a teenager. She has been on many medications in the past: prozac, lexapro, welbutrin, and several others. She is not currently on any medication or seeing a behavioral health specialist.  Contractions: Not present. Vag. Bleeding: None.  Movement: Present. Denies leaking of fluid.   Flowsheet Row Routine Prenatal from [facility]/16/2021 in [facility]  PHQ-9 Total Score 11        The following portions of the patient's history were reviewed and updated as appropriate: allergies, current medications, past family history, past medical history, past social history, past surgical history and problem list.   Objective:   Vitals:   12/16/21 0853  BP: 132/85  Pulse: (!) 107  Weight: (!) 310 lb (140.6 kg)    Fetal Status: Fetal Heart Rate (bpm): 150 Fundal Height: 20 cm Movement: Present     General:  Alert, oriented and cooperative. Patient is in no acute distress.  Skin: Skin is warm and dry. No rash noted.   Cardiovascular: Normal heart rate noted  Respiratory: Normal respiratory effort, no problems with respiration noted  Abdomen: Soft, gravid, appropriate for gestational age. Pain/Pressure: Present     Pelvic:  Cervical exam deferred        Extremities: Normal range of motion.  Edema: None  Mental Status: Normal mood and affect. Normal behavior. Normal judgment and thought content.    Assessment and Plan:  Pregnancy: G3P0020 at [redacted]w[redacted]d  1. Encounter for  supervision of low-risk pregnancy in second trimester Doing well. Discussed reassuring anatomy scan on 1/3.   2. [redacted] weeks gestation of pregnancy   3. Obesity in pregnancy Pre-gravid BMI 51. Will follow with growth [place] through MFM. Next [place] to finish anatomy on 1/31.    4. Asthma during pregnancy Stable.   5. Pre-diabetes  A1c 5.9% at initial prenatal visit. Early 2hr gtt collected today.   6. Anxiety and depression  Poorly controlled, impacting qualify of life. No SI/HI. Referred to behavioral health/Jamie placed. She is also interested in starting medication due to severity, discussed starting Zoloft. Discussed studies have shown no major pregnancy complications/defects but these have been limited, however there is an increased risk of PPH. After visit, our pregnancy coordinator reported the patient stated she had bipolar disorder to her, however this was not communicated during our visit (nor within her chart, does have FH of this). Attempted to reach patient on phone, no answer and left voicemail. Will hold off sending medication until able to confirm.   Preterm labor symptoms and general obstetric precautions including but not limited to vaginal bleeding, contractions, leaking of fluid and fetal movement were reviewed in detail with the patient. Please refer to After Visit Summary for other counseling recommendations.  Return in about 4 weeks (around 01/13/2022) for LROB.   Allayne Stack, DO

## 2021-12-17 ENCOUNTER — Telehealth: Payer: Self-pay | Admitting: Clinical

## 2021-12-17 LAB — GLUCOSE TOLERANCE, 2 HOURS W/ 1HR
Glucose, 1 hour: 176 mg/dL (ref 70–179)
Glucose, 2 hour: 119 mg/dL (ref 70–152)
Glucose, Fasting: 98 mg/dL — ABNORMAL HIGH (ref 70–91)

## 2021-12-17 NOTE — Telephone Encounter (Signed)
Attempt to schedule pt, per referral; Left HIPPA-compliant message to call back Asher Muir from [employer] for [employer] at [facility] at  [phone] [facility] office); left MyChart message for pt yesterday.

## 2021-12-23 ENCOUNTER — Telehealth: Payer: Self-pay

## 2021-12-23 DIAGNOSIS — O24419 Gestational diabetes mellitus in pregnancy, unspecified control: Secondary | ICD-10-CM

## 2021-12-23 MED ORDER — ACCU-CHEK GUIDE W/DEVICE KIT: 1.0000 | PACK | Freq: Once | 0 refills | Status: AC

## 2021-12-23 MED ORDER — ACCU-CHEK GUIDE VI STRP: ORAL_STRIP | 12 refills | Status: AC

## 2021-12-23 MED ORDER — ACCU-CHEK SOFTCLIX LANCETS MISC: 12 refills | Status: AC

## 2021-12-23 NOTE — Telephone Encounter (Addendum)
-----   Message from Allayne Stack, DO sent at 12/23/2021  9:01 AM EST ----- New GDM. Can we please send in supplies and place a referral for the diabetic coordinator?   Thank you so much, Dr Annia Friendly    Per chart review, pt read result note sent by [facility]. Called pt; VM left stating I am calling patient with results and patient may return call or read MyChart message. Referral and supplies ordered. MyChart message sent to patient.

## 2021-12-25 ENCOUNTER — Telehealth: Payer: Self-pay | Admitting: Family Medicine

## 2021-12-25 NOTE — Telephone Encounter (Signed)
Called patient again to discuss results and bipolar disorder. No answer, left voicemail.   Would prefer to discuss over the phone, however will send my chart message if unable to reach.   Recommend with history of bipolar disorder, PTSD, and multiple different medication trials needed for success in the past that she follow up with psychiatry for medication management.   Allayne Stack, DO

## 2022-01-01 ENCOUNTER — Telehealth: Payer: Self-pay | Admitting: Family Medicine

## 2022-01-01 NOTE — Telephone Encounter (Signed)
Delayed documentation. Attempted to reach patient by phone yesterday, 12/31/2021. No answer and left voicemail.   Will send a mychart message.   Allayne Stack, DO

## 2022-01-07 ENCOUNTER — Ambulatory Visit: Payer: Medicaid Other | Attending: Obstetrics

## 2022-01-07 ENCOUNTER — Encounter: Payer: Self-pay | Admitting: *Deleted

## 2022-01-07 ENCOUNTER — Other Ambulatory Visit: Payer: Self-pay | Admitting: *Deleted

## 2022-01-07 ENCOUNTER — Ambulatory Visit: Payer: Medicaid Other | Admitting: *Deleted

## 2022-01-07 ENCOUNTER — Other Ambulatory Visit: Payer: Self-pay

## 2022-01-07 VITALS — BP 113/75 | HR 97

## 2022-01-07 DIAGNOSIS — Z3A22 22 weeks gestation of pregnancy: Secondary | ICD-10-CM | POA: Insufficient documentation

## 2022-01-07 DIAGNOSIS — O99512 Diseases of the respiratory system complicating pregnancy, second trimester: Secondary | ICD-10-CM | POA: Diagnosis not present

## 2022-01-07 DIAGNOSIS — J45909 Unspecified asthma, uncomplicated: Secondary | ICD-10-CM | POA: Diagnosis not present

## 2022-01-07 DIAGNOSIS — Z6841 Body Mass Index (BMI) 40.0 and over, adult: Secondary | ICD-10-CM

## 2022-01-07 DIAGNOSIS — O99212 Obesity complicating pregnancy, second trimester: Secondary | ICD-10-CM | POA: Insufficient documentation

## 2022-01-07 DIAGNOSIS — Z362 Encounter for other antenatal screening follow-up: Secondary | ICD-10-CM | POA: Insufficient documentation

## 2022-01-07 DIAGNOSIS — O24419 Gestational diabetes mellitus in pregnancy, unspecified control: Secondary | ICD-10-CM

## 2022-01-07 DIAGNOSIS — E668 Other obesity: Secondary | ICD-10-CM

## 2022-01-07 DIAGNOSIS — O24410 Gestational diabetes mellitus in pregnancy, diet controlled: Secondary | ICD-10-CM | POA: Insufficient documentation

## 2022-01-07 DIAGNOSIS — O99210 Obesity complicating pregnancy, unspecified trimester: Secondary | ICD-10-CM

## 2022-01-07 DIAGNOSIS — Z3492 Encounter for supervision of normal pregnancy, unspecified, second trimester: Secondary | ICD-10-CM

## 2022-01-13 ENCOUNTER — Encounter: Payer: Self-pay | Admitting: Family Medicine

## 2022-01-13 ENCOUNTER — Other Ambulatory Visit: Payer: Self-pay

## 2022-01-13 ENCOUNTER — Ambulatory Visit (INDEPENDENT_AMBULATORY_CARE_PROVIDER_SITE_OTHER): Payer: Medicaid Other | Admitting: Family Medicine

## 2022-01-13 ENCOUNTER — Other Ambulatory Visit: Payer: Self-pay | Admitting: Family Medicine

## 2022-01-13 VITALS — BP 122/77 | HR 98 | Wt 305.9 lb

## 2022-01-13 DIAGNOSIS — Z3A23 23 weeks gestation of pregnancy: Secondary | ICD-10-CM

## 2022-01-13 DIAGNOSIS — J45901 Unspecified asthma with (acute) exacerbation: Secondary | ICD-10-CM

## 2022-01-13 DIAGNOSIS — O99210 Obesity complicating pregnancy, unspecified trimester: Secondary | ICD-10-CM

## 2022-01-13 DIAGNOSIS — Z3492 Encounter for supervision of normal pregnancy, unspecified, second trimester: Secondary | ICD-10-CM

## 2022-01-13 DIAGNOSIS — O24410 Gestational diabetes mellitus in pregnancy, diet controlled: Secondary | ICD-10-CM

## 2022-01-13 DIAGNOSIS — F3181 Bipolar II disorder: Secondary | ICD-10-CM

## 2022-01-13 MED ORDER — PREDNISONE 20 MG PO TABS: 40.0000 mg | ORAL_TABLET | Freq: Every day | ORAL | 0 refills | Status: AC

## 2022-01-13 MED ORDER — NEBULIZER DEVI: 0 refills | Status: DC

## 2022-01-13 MED ORDER — ALBUTEROL SULFATE (2.5 MG/3ML) 0.083% IN NEBU: 2.5000 mg | INHALATION_SOLUTION | Freq: Four times a day (QID) | RESPIRATORY_TRACT | 12 refills | Status: DC | PRN

## 2022-01-13 NOTE — Progress Notes (Signed)
Unable to find FHR on doppler, will use bedside US.

## 2022-01-13 NOTE — Progress Notes (Signed)
Subjective:  Whitney Solomon is a 26 y.o. G3P0020 at [redacted]w[redacted]d being seen today for ongoing prenatal care.  She is currently monitored for the following issues for this high-risk pregnancy and has Asthma during pregnancy; Supervision of low-risk pregnancy; and Obesity in pregnancy on their problem list.  Patient reports  concern for asthma exacerbation . She has been coughing and wheezing for about the past 1.5 weeks. Using her albuterol inhaler about 6 times daily with some relief. Consistent with her previous flares, always gets one in the winter. Denies fever, congestion, sore throat, myalgias, sick contacts.   She also reports feeling homesick, from [place]. States she can't afford a trip back home to see family. Previously wanted to hold off on medication, but has decided she would like to meet with a psychiatrist. Has not touched base with Asher Muir yet.   She has checked a few sugars since receiving supplies, but didn't realize she need to wait 2 hours to check. CBG in 90-100s.     Contractions: Not present. Vag. Bleeding: None.  Movement: Present. Denies leaking of fluid.   The following portions of the patient's history were reviewed and updated as appropriate: allergies, current medications, past family history, past medical history, past social history, past surgical history and problem list.   Objective:   Vitals:   01/13/22 1045  BP: 122/77  Pulse: 98  Weight: (!) 305 lb 14.4 oz (138.8 kg)  Pulse ox 98%   Fetal Status: Fetal Heart Rate (bpm): 140 (visualized on bedside US)  Movement: Present     General:  Alert, oriented and cooperative. Patient is in no acute distress.  Skin: Skin is warm and dry. No rash noted.   Cardiovascular: Normal heart rate noted  Respiratory: Normal respiratory effort, but with frequent cough during exam and diffuse expiratory wheeze present throughout all lung fields.   Abdomen: Soft, gravid, appropriate for gestational age. Pain/Pressure: Absent      Pelvic:  Cervical exam deferred        Extremities: Normal range of motion.  Edema: None  Mental Status: Normal mood and affect. Normal behavior. Normal judgment and thought content.    Assessment and Plan:  Pregnancy: G3P0020 at [redacted]w[redacted]d  1. Encounter for supervision of low-risk pregnancy in second trimester Doing well with normal fetal movement.   2. [redacted] weeks gestation of pregnancy Discussed expectations for labs/Tdap during next visit.   3. GDM (gestational diabetes mellitus), class A1 Given log to use and discussed appropriate CBG timing. Scheduled with diabetic coordinator later this month.  4. Obesity in pregnancy Growth [place] on 3/14   5. Bipolar 2 disorder (HCC) Known associated anxiety/depression predominant and PTSD with several medication titrations in the past. No current medications, but would like to proceed with interim therapy with Jamie/referral for medication management to psychiatry. Message sent to [facility].  - Amb ref to Integrated Behavioral Health  6. Moderate asthma with acute exacerbation, unspecified whether persistent Moderate exacerbation with diffuse wheezing. Benefit of short steroid course in 2nd trimester outweighs risk for asthma treatment. Neb with treatment sent in as well to use q4-6 for the next 24-48 hrs then PRN as tolerated. Start zyrtec daily as well. ED precautions discussed.  - predniSONE (DELTASONE) 20 MG tablet; Take 2 tablets (40 mg total) by mouth daily with breakfast for 5 days.  Dispense: 10 tablet; Refill: 0 - For home use only DME Nebulizer machine - albuterol (PROVENTIL) (2.5 MG/3ML) 0.083% nebulizer solution; Take 3 mLs (2.5 mg  total) by nebulization every 6 (six) hours as needed for wheezing or shortness of breath.  Dispense: 75 mL; Refill: 12    Preterm labor symptoms and general obstetric precautions including but not limited to vaginal bleeding, contractions, leaking of fluid and fetal movement were reviewed in detail with the  patient. Please refer to After Visit Summary for other counseling recommendations.   Return in about 4 weeks (around 02/10/2022) for HROB or sooner if needed.    Patriciaann Clan, DO

## 2022-01-13 NOTE — Addendum Note (Signed)
Addended by: Isabell Jarvis on: 01/13/2022 04:23 PM   Modules accepted: Orders

## 2022-01-13 NOTE — Progress Notes (Signed)
Fasting BS- 88

## 2022-01-13 NOTE — BH Specialist Note (Signed)
Pt did not arrive to video visit and did not answer the phone; unable to leave voice message as voicemail has not been set up; left MyChart message for patient.

## 2022-01-23 ENCOUNTER — Other Ambulatory Visit: Payer: Self-pay | Admitting: Lactation Services

## 2022-01-23 MED ORDER — FAMOTIDINE 20 MG PO TABS: 20.0000 mg | ORAL_TABLET | Freq: Two times a day (BID) | ORAL | 2 refills | Status: AC

## 2022-01-27 ENCOUNTER — Ambulatory Visit: Payer: Medicaid Other | Admitting: Clinical

## 2022-01-27 DIAGNOSIS — Z91199 Patient's noncompliance with other medical treatment and regimen due to unspecified reason: Secondary | ICD-10-CM

## 2022-01-28 ENCOUNTER — Other Ambulatory Visit: Payer: Medicaid Other

## 2022-01-29 ENCOUNTER — Encounter (HOSPITAL_COMMUNITY): Payer: Self-pay | Admitting: Emergency Medicine

## 2022-01-29 ENCOUNTER — Other Ambulatory Visit: Payer: Self-pay

## 2022-01-29 ENCOUNTER — Emergency Department (HOSPITAL_COMMUNITY)
Admission: EM | Admit: 2022-01-29 | Discharge: 2022-01-29 | Disposition: A | Discharge status: Home / Self Care | Payer: Medicaid Other | Attending: Emergency Medicine | Admitting: Emergency Medicine

## 2022-01-29 DIAGNOSIS — Z3A00 Weeks of gestation of pregnancy not specified: Secondary | ICD-10-CM | POA: Diagnosis not present

## 2022-01-29 DIAGNOSIS — O26892 Other specified pregnancy related conditions, second trimester: Secondary | ICD-10-CM | POA: Insufficient documentation

## 2022-01-29 DIAGNOSIS — J4521 Mild intermittent asthma with (acute) exacerbation: Secondary | ICD-10-CM | POA: Diagnosis not present

## 2022-01-29 DIAGNOSIS — J45901 Unspecified asthma with (acute) exacerbation: Secondary | ICD-10-CM

## 2022-01-29 DIAGNOSIS — O99512 Diseases of the respiratory system complicating pregnancy, second trimester: Secondary | ICD-10-CM | POA: Insufficient documentation

## 2022-01-29 MED ORDER — METHYLPREDNISOLONE SODIUM SUCC 125 MG IJ SOLR
80.0000 mg | Freq: Once | INTRAMUSCULAR | Status: AC
  Administered 2022-01-29: 80 mg via INTRAVENOUS
  Filled 2022-01-29: qty 2

## 2022-01-29 MED ORDER — ALBUTEROL SULFATE (2.5 MG/3ML) 0.083% IN NEBU: INHALATION_SOLUTION | RESPIRATORY_TRACT | 12 refills | Status: AC

## 2022-01-29 MED ORDER — ALBUTEROL SULFATE (2.5 MG/3ML) 0.083% IN NEBU
10.0000 mg/h | INHALATION_SOLUTION | Freq: Once | RESPIRATORY_TRACT | Status: AC
  Administered 2022-01-29: 10 mg/h via RESPIRATORY_TRACT
  Filled 2022-01-29: qty 12

## 2022-01-29 MED ORDER — ALBUTEROL SULFATE HFA 108 (90 BASE) MCG/ACT IN AERS
1.0000 | INHALATION_SPRAY | Freq: Once | RESPIRATORY_TRACT | Status: AC
  Administered 2022-01-29: 1 via RESPIRATORY_TRACT
  Filled 2022-01-29: qty 6.7

## 2022-01-29 MED ORDER — NEBULIZER DEVI
0 refills | Status: AC
Start: 2022-01-29 — End: ?

## 2022-01-29 MED ORDER — IPRATROPIUM BROMIDE 0.02 % IN SOLN
0.5000 mg | Freq: Once | RESPIRATORY_TRACT | Status: AC
  Administered 2022-01-29: 0.5 mg via RESPIRATORY_TRACT
  Filled 2022-01-29: qty 2.5

## 2022-01-29 NOTE — Discharge Instructions (Signed)
You can continue to use your albuterol inhaler as needed.  Please follow-up with your regular physician regarding your symptoms as well as this visit to the emergency department.  If you develop any new or worsening symptoms please come back to the emergency department immediately.

## 2022-01-29 NOTE — ED Triage Notes (Signed)
Patient reports asthma attack this morning with wheezing/SOB and dry cough , she is 6 months pregnant .

## 2022-01-29 NOTE — ED Provider Notes (Signed)
[facility] DEPARTMENT Provider Note   CSN: [id number] Arrival date & time: 01/29/22  0442     History  Chief Complaint  Patient presents with   Asthma    6 months pregnant    Whitney Solomon is a 26 y.o. female.  HPI Patient is a 26 year old G1 female who is approximately 6 months pregnant.  She presents to the emergency department due to wheezing and shortness of breath.  Patient states she has a history of asthma.  States that she has been experiencing more frequent asthma exacerbations over the past month.  States that she ran out of her albuterol inhaler about 1 week ago and was prescribed a prednisone burst which she finished about 3 days ago.  She states that she was also prescribed a nebulizer for use at home but has been unable to fill this with her pharmacy as of yet.  Earlier tonight she began experiencing worsening wheezing, shortness of breath, as well as chest tightness that she states is consistent with prior asthma exacerbations.  Denies any abdominal pain or other complaints at this time.    Home Medications Prior to Admission medications   Medication Sig Start Date End Date Taking? Authorizing Provider  acetaminophen (TYLENOL) 500 MG tablet Take 1,000 mg by mouth every 6 (six) hours as needed for moderate pain or headache.   Yes [provider]  albuterol (VENTOLIN HFA) 108 (90 Base) MCG/ACT inhaler Inhale 2 puffs into the lungs every 6 (six) hours as needed for wheezing or shortness of breath. 09/30/21  Yes Caren Macadam, MD  famotidine (PEPCID) 20 MG tablet Take 1 tablet (20 mg total) by mouth 2 (two) times daily. Patient taking differently: Take 20 mg by mouth 2 (two) times daily as needed for heartburn or indigestion. 01/23/22  Yes Caren Macadam, MD  ondansetron (ZOFRAN ODT) 8 MG disintegrating tablet Take 1 tablet (8 mg total) by mouth every 8 (eight) hours as needed for nausea or vomiting. 10/23/21  Yes Walker,  Samella Parr R, CNM  Prenatal Vit-Fe Fumarate-FA (MULTIVITAMIN-PRENATAL) 27-0.8 MG TABS tablet Take 1 tablet by mouth daily.   Yes [provider]  triamcinolone ointment (KENALOG) 0.5 % Apply 1 application topically 2 (two) times daily. Patient taking differently: Apply 1 application topically 2 (two) times daily as needed (rash). 12/10/21  Yes Clarnce Flock, MD  Accu-Chek Softclix Lancets lancets Use as instructed; check blood glucose 4 times daily 12/23/21   Patriciaann Clan, DO  albuterol (PROVENTIL) (2.5 MG/3ML) 0.083% nebulizer solution INHALE 3 ML BY NEBULIZATION EVERY 6 HOURS AS NEEDED FOR WHEEZING OR SHORTNESS OF BREATH Patient not taking: Reported on 01/29/2022 01/13/22   Patriciaann Clan, DO  Blood Glucose Monitoring Suppl (ACCU-CHEK GUIDE) w/Device KIT See admin instructions. 12/23/21   [provider]  Blood Pressure Monitoring (BLOOD PRESSURE KIT) DEVI 1 Device by Does not apply route as needed. Will need large cuff ICD 10 Z34.90 12/16/21   Patriciaann Clan, DO  glucose blood (ACCU-CHEK GUIDE) test strip Use as instructed; check blood glucose 4 times daily 12/23/21   Patriciaann Clan, DO  Respiratory Therapy Supplies (NEBULIZER) DEVI 1 device. Use with nebulizer treatment every 6 hours PRN. 01/13/22   Patriciaann Clan, DO      Allergies    Patient has no known allergies.    Review of Systems   Review of Systems  All other systems reviewed and are negative. Ten systems reviewed and are negative  for acute change, except as noted in the HPI.   Physical Exam Updated Vital Signs BP (!) 141/89 (BP Location: Right Arm)    Pulse 98    Temp 97.6 F (36.4 C) (Oral)    Resp 20    LMP 07/26/2021 (Exact Date)    SpO2 100%  Physical Exam Vitals and nursing note reviewed.  Constitutional:      General: She is not in acute distress.    Appearance: Normal appearance. She is not ill-appearing, toxic-appearing or diaphoretic.  HENT:     Head: Normocephalic and atraumatic.      Right Ear: External ear normal.     Left Ear: External ear normal.     Nose: Nose normal.     Mouth/Throat:     Mouth: Mucous membranes are moist.     Pharynx: Oropharynx is clear. No oropharyngeal exudate or posterior oropharyngeal erythema.  Eyes:     Extraocular Movements: Extraocular movements intact.  Cardiovascular:     Rate and Rhythm: Normal rate and regular rhythm.     Pulses: Normal pulses.     Heart sounds: Normal heart sounds. No murmur heard.   No friction rub. No gallop.     Comments: Regular rate and rhythm without murmurs, rubs or gallops. Pulmonary:     Effort: Pulmonary effort is normal. No respiratory distress.     Breath sounds: No stridor. Wheezing present. No rhonchi or rales.     Comments: Expiratory wheezing noted in all fields bilaterally.  Oxygen saturations at 99% on room air.  Speaking in clear and complete sentences.  Mild tachypnea. Abdominal:     General: Abdomen is flat.     Palpations: Abdomen is soft.     Tenderness: There is no abdominal tenderness.     Comments: Gravid abdomen.  Soft and nontender.  Musculoskeletal:        General: Normal range of motion.     Cervical back: Normal range of motion and neck supple. No tenderness.  Skin:    General: Skin is warm and dry.  Neurological:     General: No focal deficit present.     Mental Status: She is alert and oriented to person, place, and time.  Psychiatric:        Mood and Affect: Mood normal.        Behavior: Behavior normal.   ED Results / Procedures / Treatments   Labs (all labs ordered are listed, but only abnormal results are displayed) Labs Reviewed - No data to display  EKG None  Radiology No results found.  Procedures Procedures   Medications Ordered in ED Medications  albuterol (VENTOLIN HFA) 108 (90 Base) MCG/ACT inhaler 1 puff (has no administration in time range)  albuterol (PROVENTIL) (2.5 MG/3ML) 0.083% nebulizer solution (10 mg/hr Nebulization Given 01/29/22 0535)   ipratropium (ATROVENT) nebulizer solution 0.5 mg (0.5 mg Nebulization Given 01/29/22 0523)  methylPREDNISolone sodium succinate (SOLU-MEDROL) 125 mg/2 mL injection 80 mg (80 mg Intravenous Given 01/29/22 0523)   ED Course/ Medical Decision Making/ A&P Clinical Course as of 01/29/22 0620  Wed Jan 29, 2022  0515 Expiratory wheezing noted with saturations at 99% on RA.  Mildly tachypneic.  Will start patient on continuous albuterol nebulizer as well as 0.5 mg of Atrovent.  IV Solu-Medrol.  Will reassess. [LJ]    Clinical Course User Index [LJ] Rayna Sexton, PA-C  Medical Decision Making Risk Prescription drug management.  Pt is a 26 y.o. female who presents to the emergency department due to wheezing and shortness of breath.  Patient has a history of asthma and states that her current symptoms are consistent with prior asthma exacerbations.  She recently ran out of her albuterol inhaler about 1 week ago and has not been able to obtain a new one.  Also notes that she was prescribed nebulizers which she has been unable to obtain through her pharmacy.  On my exam heart is regular rate and rhythm without murmurs, rubs or gallops.  Gravid abdomen that is soft and nontender.  Patient has expiratory wheezing in all lung fields but is maintaining oxygen saturations in the high 90s on room air.  Patient was treated with Solu-Medrol, Atrovent, as well as a continuous albuterol nebulizer.  Patient reassessed and wheezing is completely resolved.  Denies any further shortness of breath.  Patient was recently on a prednisone burst so we will not discharge on additional steroids.  We will give patient an albuterol inhaler that she can take home.  Feel the patient is stable for discharge at this time and she is agreeable.  Recommended follow-up with her PCP.  Discussed return precautions.  Her questions were answered and she was amicable at the time of discharge.  Note: Portions of  this report may have been transcribed using voice recognition software. Every effort was made to ensure accuracy; however, inadvertent computerized transcription errors may be present.   Final Clinical Impression(s) / ED Diagnoses Final diagnoses:  Mild intermittent asthma with exacerbation   Rx / DC Orders ED Discharge Orders     None         Rayna Sexton, PA-C 01/29/22 2960    Orpah Greek, MD 01/29/22 [phone]

## 2022-02-10 ENCOUNTER — Encounter: Payer: Medicaid Other | Admitting: Obstetrics & Gynecology

## 2022-02-18 ENCOUNTER — Ambulatory Visit: Payer: Medicaid Other

## 2022-02-18 ENCOUNTER — Ambulatory Visit: Payer: Medicaid Other | Attending: Maternal & Fetal Medicine

## 2022-06-12 ENCOUNTER — Telehealth: Payer: Self-pay

## 2022-06-12 NOTE — Telephone Encounter (Signed)
Encounter opened to determine if patient has delivered outside of our hospital system. Information needed for follow up by prenatal navigators. Unable to determine where patient delivered.

## 2022-12-16 IMAGING — US US MFM OB FOLLOW-UP
1 series · 13 of 28 positions shown · non-contrast
Comparison: none

[Series 1: us mfm ob follow-up · 13 of 69 slices shown]
[im 3/69]
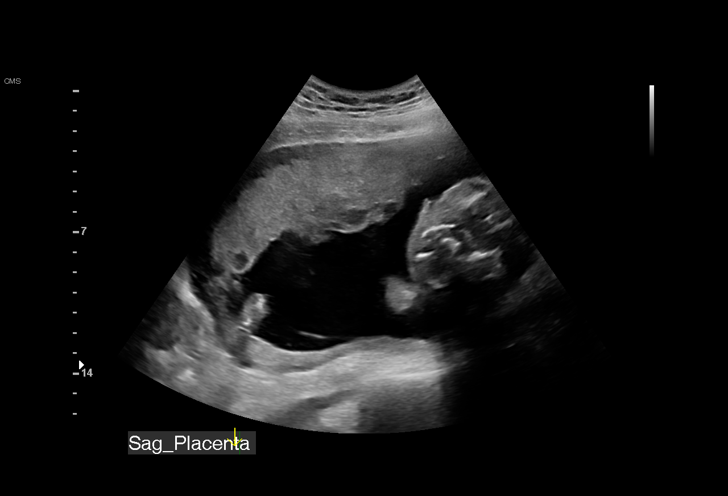
[im 8/69]
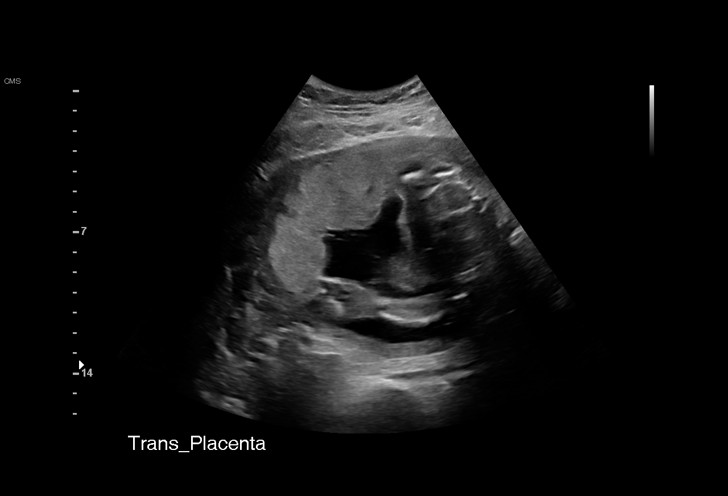
[im 13/69]
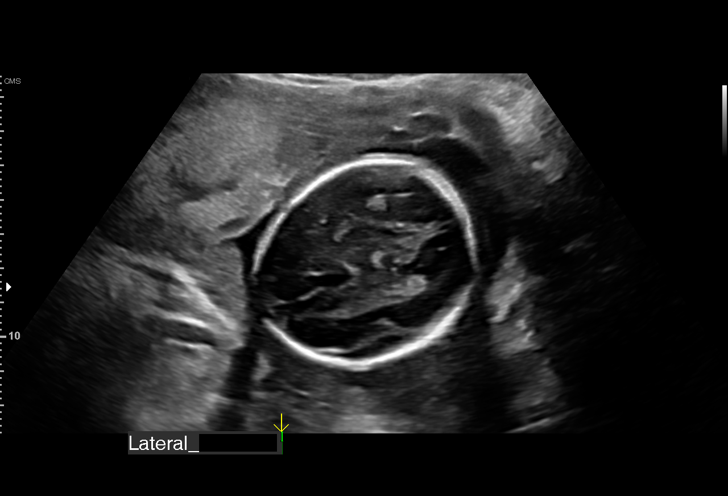
[im 18/69]
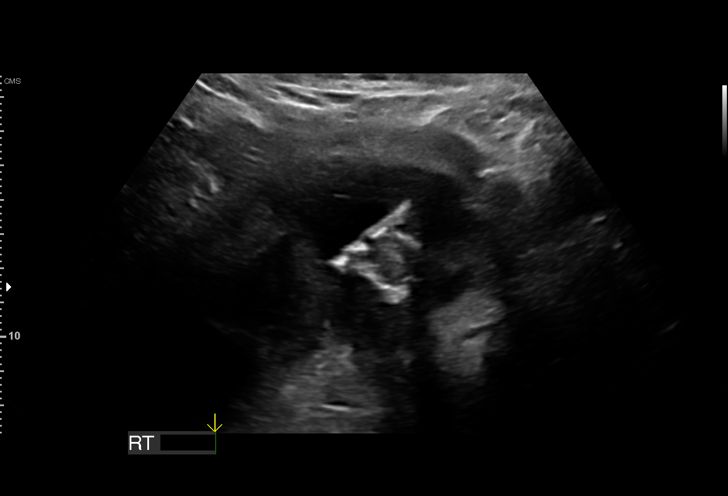
[im 23/69]
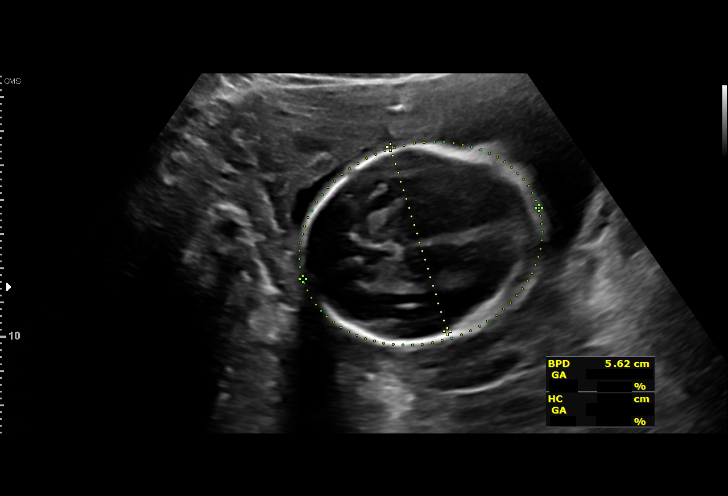
[im 28/69]
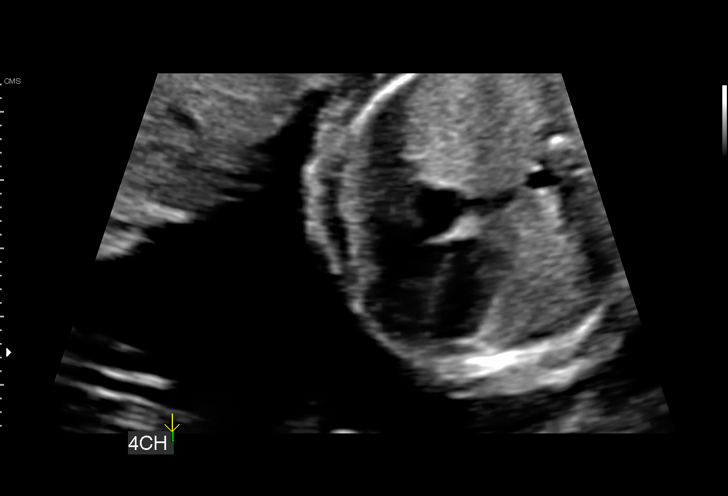
[im 36/69]
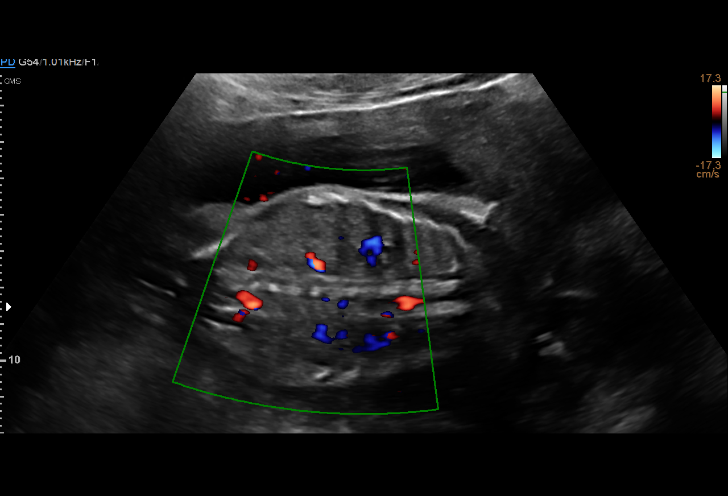
[im 41/69]
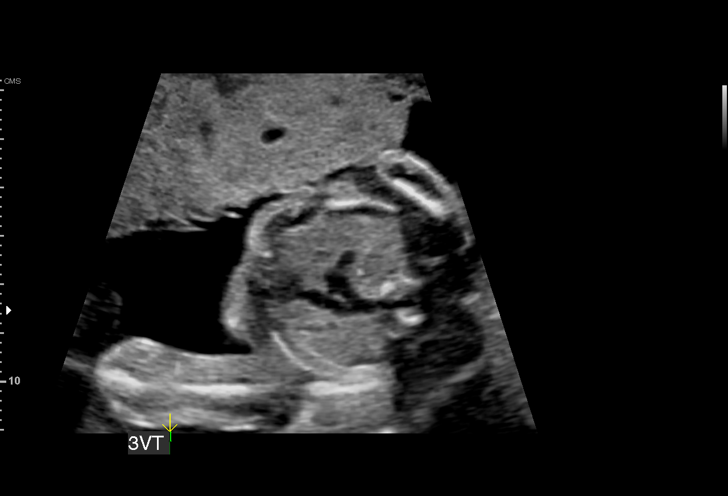
[im 46/69]
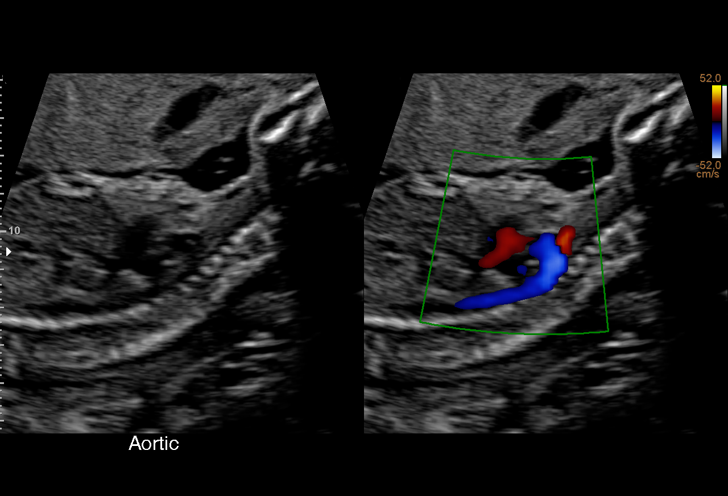
[im 51/69]
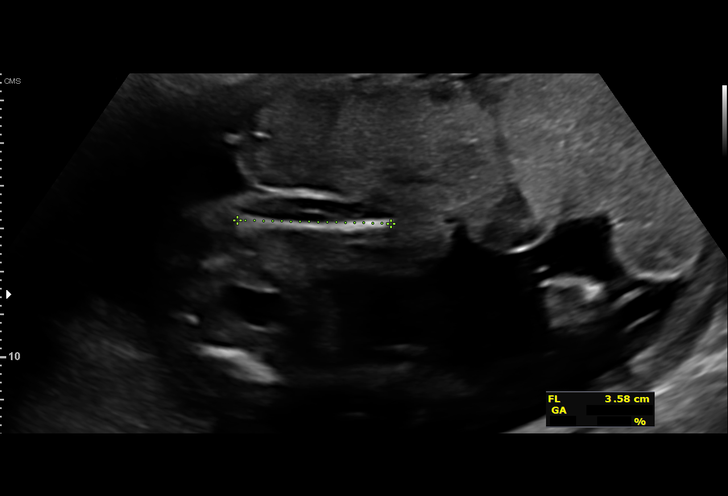
[im 56/69]
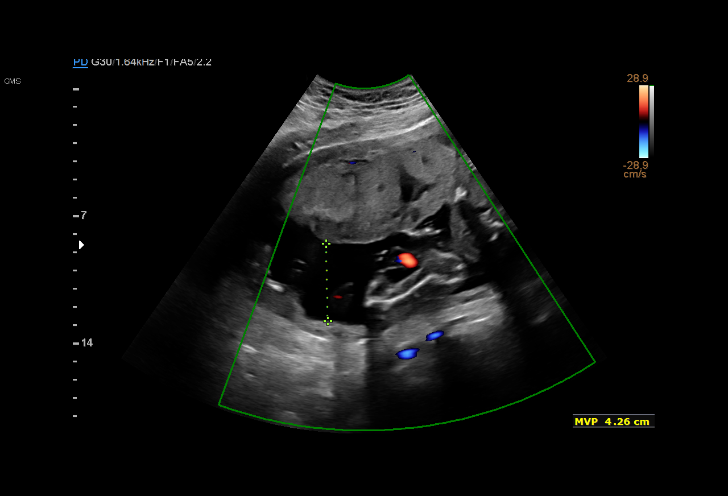
[im 61/69]
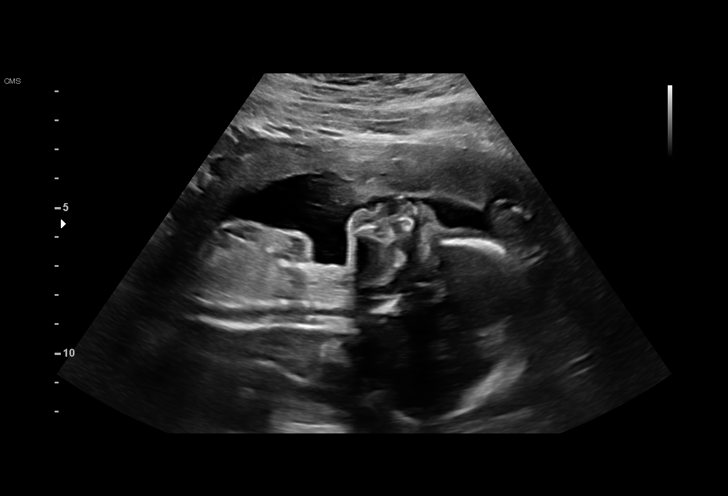
[im 66/69]
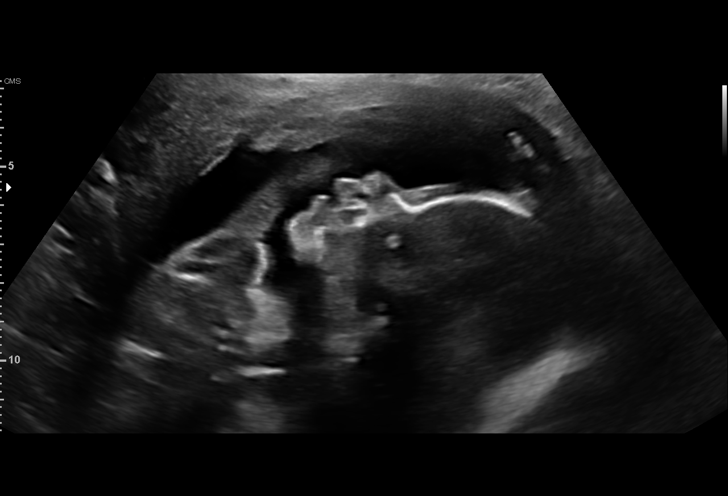

[13 of 28 positions shown; findings below may reference images not displayed]

[REDACTED]care
                   WALKER CNM

Indications

 22 weeks gestation of pregnancy
 Obesity complicating pregnancy, second
 trimester BMI 51
 Antenatal follow-up for nonvisualized fetal
 anatomy
 LR NIPS, Neg Horizon
 Asthma                                         UXX.5X [id number]
 Gestational diabetes in pregnancy, diet
 controlled
Fetal Evaluation

 Num Of Fetuses:         1
 Fetal Heart Rate(bpm):  148
 Cardiac Activity:       Observed
 Presentation:           Cephalic
 Placenta:               Anterior
 P. Cord Insertion:      Visualized

 Amniotic Fluid
 AFI FV:      Within normal limits

                             Largest Pocket(cm)

Biometry
 BPD:      56.3  mm     G. Age:  23w 1d         75  %    CI:        75.67   %    70 - 86
                                                         FL/HC:      17.5   %    18.4 -
 HC:      205.2  mm     G. Age:  22w 4d         45  %    HC/AC:      1.17        1.06 -
 AC:      175.7  mm     G. Age:  22w 3d         44  %    FL/BPD:     63.9   %    71 - 87
 FL:         36  mm     G. Age:  21w 3d         12  %    FL/AC:      20.5   %    20 - 24
 LV:        5.4  mm

 Est. FW:     477  gm      1 lb 1 oz     28  %
OB History

 Gravidity:    3          SAB:   2
 Living:       0
Gestational Age

 LMP:           23w 4d        Date:  07/26/21                 EDD:   05/02/22
 U/S Today:     22w 3d                                        EDD:   05/10/22
 Best:          22w 3d     Det. By:  U/S  (12/10/21)          EDD:   05/10/22
Anatomy

 Cranium:               Appears normal         LVOT:                   Appears normal
 Cavum:                 Appears normal         Aortic Arch:            Appears normal
 Ventricles:            Appears normal         Ductal Arch:            Previously seen
 Choroid Plexus:        Appears normal         Diaphragm:              Not well visualized
 Cerebellum:            Appears normal         Stomach:                Appears normal, left
                                                                       sided
 Posterior Fossa:       Appears normal         Abdomen:                Appears normal
 Nuchal Fold:           Previously seen        Abdominal Wall:         Appears nml (cord
                                                                       insert, abd wall)
 Face:                  Appears normal         Cord Vessels:           Appears normal (3
                        (orbits and profile)                           vessel cord)
 Lips:                  Appears normal         Kidneys:                Appear normal
 Palate:                Appears normal         Bladder:                Appears normal
 Thoracic:              Appears normal         Spine:                  Previously seen
 Heart:                 Appears normal         Upper Extremities:      Previously seen
                        (4CH, axis, and
                        situs)
 RVOT:                  Appears normal         Lower Extremities:      Previously seen

 Other:  Nasal bone, lenses, maxilla, mandible, LEFT open hand/5th digit,
         LEFT heel and RIGHT foot, VC, 3VV and 3VTV visualized. Male
         gender previously seen. Technically difficult due to maternal habit
Cervix Uterus Adnexa

 Cervix
 Length:           3.82  cm.
 Normal appearance by transabdominal scan.

 Uterus
 No abnormality visualized.

 Right Ovary
 Within normal limits.
 Left Ovary
 Within normal limits.

 Cul De Sac
 No free fluid seen.

 Adnexa
 No abnormality visualized.
Impression

 Follow up growth due to elevated BMI and new diagnosis of
 9S3KG
 Normal interval growth with measurements consistent with
 dates
 Good fetal movement and amniotic fluid volume
 Anatomy is completed.
Recommendations

 Follow up growth in 6 weeks for serial growth exams.
